# Patient Record
Sex: Female | Born: 1980 | Race: White | Hispanic: No | Marital: Married | Smoking: Former smoker
Health system: Southern US, Community
[De-identification: ages and names within clinical notes are randomized; demographics above are authoritative.]

---

## 2018-06-16 ENCOUNTER — Other Ambulatory Visit: Payer: Self-pay | Admitting: Gastroenterology

## 2018-06-16 DIAGNOSIS — R109 Unspecified abdominal pain: Secondary | ICD-10-CM

## 2018-06-28 ENCOUNTER — Ambulatory Visit
Admission: RE | Admit: 2018-06-28 | Discharge: 2018-06-28 | Disposition: A | Discharge status: Home / Self Care | Payer: BLUE CROSS/BLUE SHIELD | Source: Ambulatory Visit | Attending: Gastroenterology | Admitting: Gastroenterology

## 2018-06-28 ENCOUNTER — Encounter: Payer: Self-pay | Admitting: Radiology

## 2018-06-28 DIAGNOSIS — R109 Unspecified abdominal pain: Secondary | ICD-10-CM

## 2018-06-28 MED ORDER — IOPAMIDOL (ISOVUE-300) INJECTION 61%
100.0000 mL | Freq: Once | INTRAVENOUS | Status: AC | PRN
  Administered 2018-06-28: 100 mL via INTRAVENOUS

## 2020-09-20 ENCOUNTER — Institutional Professional Consult (permissible substitution): Payer: BLUE CROSS/BLUE SHIELD | Admitting: Pulmonary Disease

## 2020-10-04 ENCOUNTER — Ambulatory Visit: Payer: BLUE CROSS/BLUE SHIELD | Admitting: Pulmonary Disease

## 2020-10-04 ENCOUNTER — Encounter: Payer: Self-pay | Admitting: Pulmonary Disease

## 2020-10-04 ENCOUNTER — Other Ambulatory Visit: Payer: Self-pay

## 2020-10-04 VITALS — BP 110/70 | HR 68 | Temp 98.0°F | Ht 62.0 in | Wt 118.8 lb

## 2020-10-04 DIAGNOSIS — R042 Hemoptysis: Secondary | ICD-10-CM

## 2020-10-04 NOTE — Progress Notes (Signed)
Terri Spencer    [id number]    Aug 18, 1980  Primary Care Physician:[facility]  Referring Physician: [facility] [address] [place],  [place] [address]  Chief complaint: Consult for hemoptysis  HPI: 40 year old with no significant past medical history Complains of intermittent cough with black specks in the sputum.  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 Smoking history: 10-15-pack-year smoker.  Quit in 2005 Travel history: No significant travel history Relevant family history: Grandfather died of lung cancer.  He was a smoker.   Outpatient Encounter Medications as of 10/04/2020  Medication Sig  . Multiple Vitamin (MULTIVITAMIN) capsule Take by mouth.   No facility-administered encounter medications on file as of 10/04/2020.    Allergies as of 10/04/2020 - Review Complete 10/04/2020  Allergen Reaction Noted  . Amoxicillin Rash 10/22/2017    No past medical history on file.  History reviewed. No pertinent surgical history.  No family history on file.  Social History   Socioeconomic History  . Marital status: Married    Spouse name: Not on file  . Number of children: Not on file  . Years of education: Not on file  . Highest education level: Not on file  Occupational History  . Not on file  Tobacco Use  . Smoking status: Former Smoker    Years: 12.00    Types: Cigarettes    Quit date: 07/07/1995    Years since quitting: 25.2  . Smokeless tobacco: Never Used  Substance and Sexual Activity  . Alcohol use: Not on file  . Drug use: Not on file  . Sexual activity: Not on file  Other Topics Concern  . Not on file  Social History Narrative  . Not on file   Social Determinants of Health    Financial Resource Strain: Not on file  Food Insecurity: Not on file  Transportation Needs: Not on file  Physical Activity: Not on file  Stress: Not on file  Social Connections: Not on file  Intimate Partner Violence: Not on file    Review of systems: Review of Systems  Constitutional: Negative for fever and chills.  HENT: Negative.   Eyes: Negative for blurred vision.  Respiratory: as per HPI  Cardiovascular: Negative for chest pain and palpitations.  Gastrointestinal: Negative for vomiting, diarrhea, blood per rectum. Genitourinary: Negative for dysuria, urgency, frequency and hematuria.  Musculoskeletal: Negative for myalgias, back pain and joint pain.  Skin: Negative for itching and rash.  Neurological: Negative for dizziness, tremors, focal weakness, seizures and loss of consciousness.  Endo/Heme/Allergies: Negative for environmental allergies.  Psychiatric/Behavioral: Negative for depression, suicidal ideas and hallucinations.  All other systems reviewed and are negative.  Physical Exam: Blood pressure 110/70, pulse 68, temperature 98 F (36.7 C), temperature source Temporal, height 5\' 2"  (1.575 m), weight 118 lb 12.8 oz (53.9 kg), SpO2 98 %. Gen:      No acute distress HEENT:  EOMI, sclera anicteric Neck:     No masses; no thyromegaly Lungs:    Clear to auscultation bilaterally; normal respiratory effort CV:         Regular rate and rhythm; no murmurs Abd:      + bowel sounds; soft, non-tender; no palpable masses, no distension Ext:  No edema; adequate peripheral perfusion Skin:      Warm and dry; no rash Neuro: alert and oriented x 3 Psych: normal mood and affect  Data Reviewed: Imaging: CT abdomen pelvis 06/28/2018-mild atelectatic changes/groundglass at the base I have reviewed the images personally  PFTs:  Labs:  Assessment:  Evaluation of cough Concern for hemoptysis though she is describing black specks in the mucus and not overt blood Given  history of smoking we will schedule a CT We will also enable [place] to evaluate for hypersensitivity pneumonitis given down exposure. Prior CT abdomen in 2019 noted with mild atelectatic changes/groundglass at the base.  Plan/Recommendations: - High-resolution CT chest   Chilton Greathouse MD [facility] 10/04/2020, 10:56 AM  CC: [facility]

## 2020-10-04 NOTE — Patient Instructions (Signed)
We will schedule you for high-resolution CT for evaluation of hemoptysis and lung inflammation Follow-up in 1 to 2 months.

## 2020-10-18 ENCOUNTER — Other Ambulatory Visit: Payer: BC Managed Care – PPO

## 2020-10-29 ENCOUNTER — Other Ambulatory Visit: Payer: BC Managed Care – PPO

## 2020-11-13 ENCOUNTER — Ambulatory Visit: Payer: BC Managed Care – PPO | Admitting: Pulmonary Disease

## 2020-11-28 ENCOUNTER — Other Ambulatory Visit: Payer: BC Managed Care – PPO

## 2021-01-22 ENCOUNTER — Ambulatory Visit
Admission: RE | Admit: 2021-01-22 | Discharge: 2021-01-22 | Disposition: A | Discharge status: Home / Self Care | Payer: BC Managed Care – PPO | Source: Ambulatory Visit | Attending: Pulmonary Disease | Admitting: Pulmonary Disease

## 2021-01-22 ENCOUNTER — Other Ambulatory Visit: Payer: Self-pay

## 2021-01-22 DIAGNOSIS — R042 Hemoptysis: Secondary | ICD-10-CM

## 2021-01-29 ENCOUNTER — Encounter: Payer: Self-pay | Admitting: *Deleted

## 2021-03-05 ENCOUNTER — Ambulatory Visit: Payer: BC Managed Care – PPO | Admitting: Pulmonary Disease

## 2021-04-09 ENCOUNTER — Ambulatory Visit: Payer: BC Managed Care – PPO | Admitting: Pulmonary Disease

## 2021-04-11 ENCOUNTER — Ambulatory Visit: Payer: BC Managed Care – PPO | Admitting: Pulmonary Disease

## 2021-05-17 ENCOUNTER — Ambulatory Visit: Payer: BC Managed Care – PPO | Admitting: Pulmonary Disease

## 2021-08-02 ENCOUNTER — Other Ambulatory Visit: Payer: Self-pay | Admitting: Gastroenterology

## 2021-08-02 ENCOUNTER — Ambulatory Visit
Admission: RE | Admit: 2021-08-02 | Discharge: 2021-08-02 | Disposition: A | Discharge status: Home / Self Care | Payer: BC Managed Care – PPO | Source: Ambulatory Visit | Attending: Gastroenterology | Admitting: Gastroenterology

## 2021-08-02 DIAGNOSIS — K5900 Constipation, unspecified: Secondary | ICD-10-CM

## 2021-08-02 DIAGNOSIS — R109 Unspecified abdominal pain: Secondary | ICD-10-CM

## 2021-09-30 ENCOUNTER — Ambulatory Visit: Payer: BC Managed Care – PPO | Admitting: Pulmonary Disease

## 2021-10-22 ENCOUNTER — Ambulatory Visit: Payer: BC Managed Care – PPO | Admitting: Pulmonary Disease

## 2021-10-23 ENCOUNTER — Ambulatory Visit: Payer: BC Managed Care – PPO

## 2021-10-23 ENCOUNTER — Encounter: Payer: Self-pay | Admitting: Pulmonary Disease

## 2021-10-23 ENCOUNTER — Ambulatory Visit: Payer: BC Managed Care – PPO | Admitting: Pulmonary Disease

## 2021-10-23 VITALS — BP 112/62 | HR 65 | Temp 97.8°F | Ht 62.0 in | Wt 118.2 lb

## 2021-10-23 DIAGNOSIS — R058 Other specified cough: Secondary | ICD-10-CM | POA: Diagnosis not present

## 2021-10-23 NOTE — Progress Notes (Signed)
? ?      ?  Terri Spencer    [id number]    03-Dec-1980 ? ?Primary Care Physician:[facility] ? ?Referring Physician: [facility] ?[address] ?[place],  [place] [address] ? ?Chief complaint: Follow-up for hemoptysis ? ?HPI: ?41 year old with no significant past medical history ?Complains of intermittent cough with black specks in the sputum.  Not associated with shortness of breath ?She has a smoking history and has been referred for further evaluation ? 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 ?Smoking history: 10-15-pack-year smoker.  Quit in 2005 ?Travel history: No significant travel history ?Relevant family history: Grandfather died of lung cancer.  He was a smoker. ? ?Interim history: ?She had a high-resolution CT last year for evaluation of intermittent cough with black specks with no clear source identified.  Her lungs were.  She brought photos to show me. ? ?Denies any dyspnea, fevers, chills or congestion ? ? ?Outpatient Encounter Medications as of 10/23/2021  ?Medication Sig  ? Multiple Vitamin (MULTIVITAMIN) capsule Take by mouth.  ? ?No facility-administered encounter medications on file as of 10/23/2021.  ? ? ?Physical Exam: ?Blood pressure 112/62, pulse 65, temperature 97.8 ?F (36.6 ?C), temperature source Oral, height 5\' 2"  (1.575 m), weight 118 lb 3.2 oz (53.6 kg), SpO2 98 %. ?Gen:      No acute distress ?HEENT:  EOMI, sclera anicteric ?Neck:     No masses; no thyromegaly ?Lungs:    Clear to auscultation bilaterally; normal respiratory effort ?CV:         Regular rate and rhythm; no murmurs ?Abd:      + bowel sounds; soft, non-tender; no palpable masses, no distension ?Ext:    No edema; adequate peripheral perfusion ?Skin:      Warm and dry; no rash ?Neuro: alert and oriented x 3 ?Psych: normal mood and affect  ? ?Data Reviewed: ?Imaging: ?CT abdomen  pelvis 06/28/2018-mild atelectatic changes/groundglass at the base ? ?High-resolution CT 01/22/2021-mild bibasal atelectasis.  No interstitial lung disease ?I have reviewed the images personally ? ?PFTs: ? ? ?Labs: ? ? ?Assessment:  ?Evaluation of cough ?Concern for hemoptysis though she is describing black specks in the mucus and not overt blood ?Her CT did not show any obvious source.  She does have down exposure but there is no evidence of hypersensitivity pneumonitis. ? ?Since this continues to be a recurrent problem I have suggested repeat CT scan and bronchoscopy for airway evaluation.  But she declined due to concerns about radiation and complications of procedure ?We have agreed to get a chest x-ray today and continue monitoring.  If this recurs then then we will reevaluate. ?Follow-up in 6 months. ? ?Plan/Recommendations: ?- Chest x-ray ?Follow-up in 6 months ? ?01/24/2021 MD ?[facility] ?10/23/2021, 2:16 PM ? ?CC: [facility] ? ?

## 2021-10-23 NOTE — Patient Instructions (Signed)
We will get a chest x-ray today Follow-up in 6 months 

## 2023-03-20 IMAGING — CT CT CHEST HIGH RESOLUTION W/O CM
3 of 6 series · 17 of 32 positions shown, 19 images · non-contrast
Comparison: None.

CLINICAL DATA: Hemoptysis, intermittent

EXAM:
CT CHEST WITHOUT CONTRAST
TECHNIQUE: Multidetector CT imaging of the chest was performed following the
standard protocol without intravenous contrast. High resolution
imaging of the lungs, as well as inspiratory and expiratory imaging,
was performed.

[Series 3: chest · axial · 0.66mm/px · z∈[-241,-53]mm · 6 of 142 slices shown, 8 images]
[im 24/142  mediastinal]
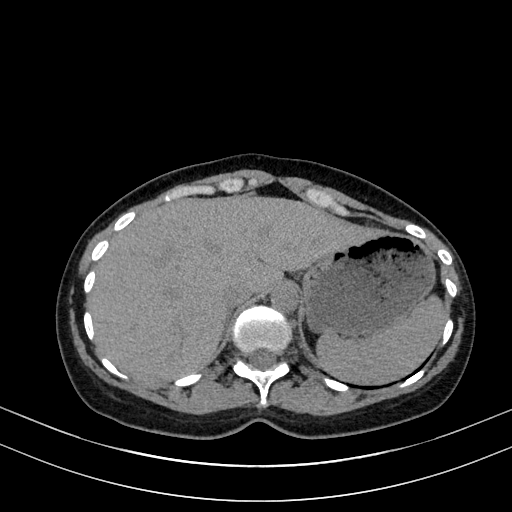
[im 24/142  lung]
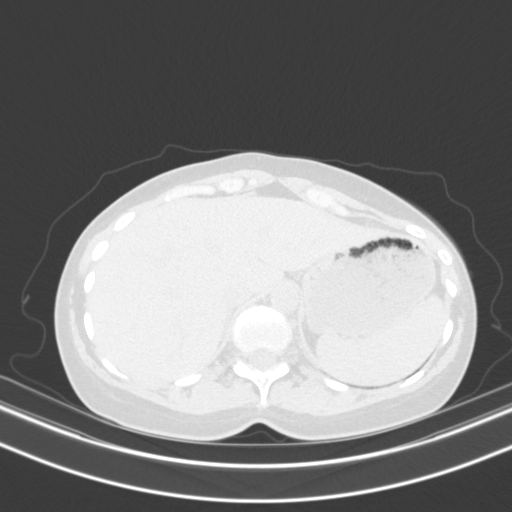
[im 48/142  lung]
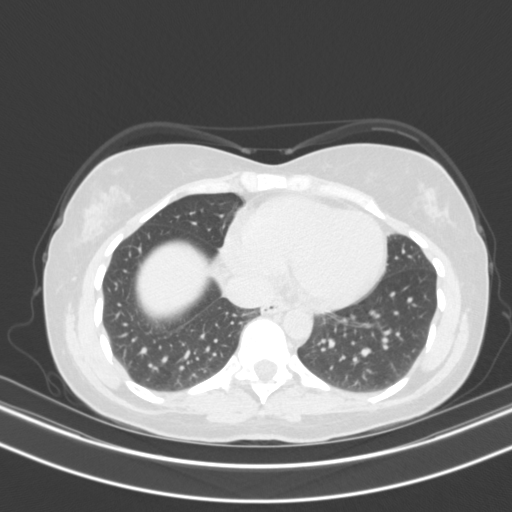
[im 67/142  lung]
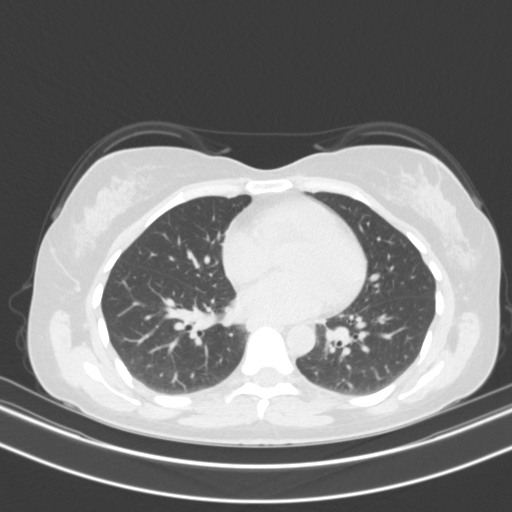
[im 71/142  lung]
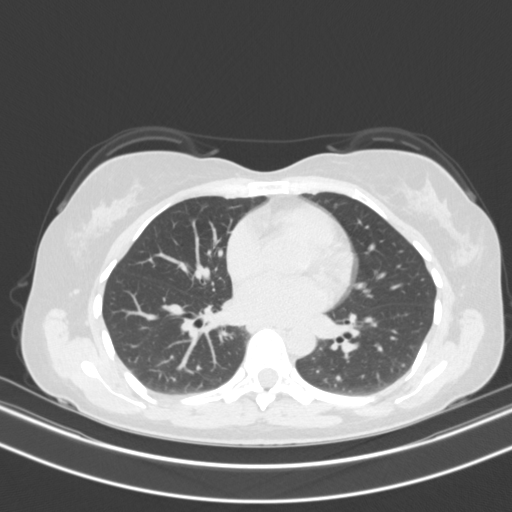
[im 95/142  mediastinal]
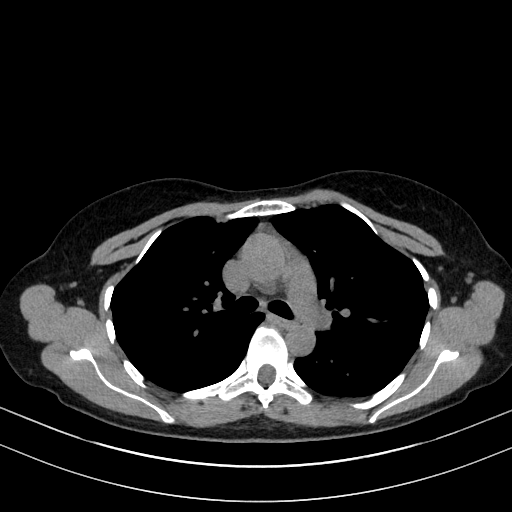
[im 95/142  lung]
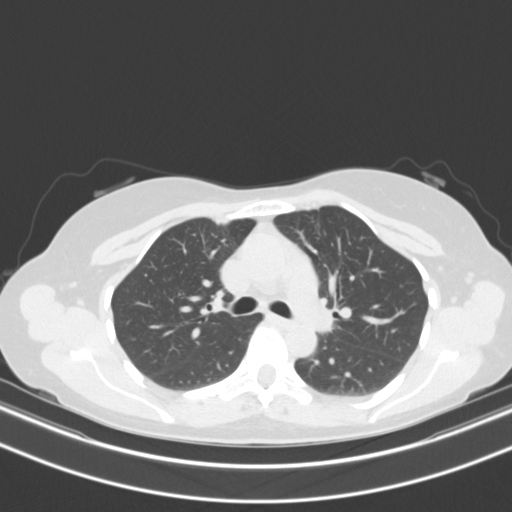
[im 118/142  lung]
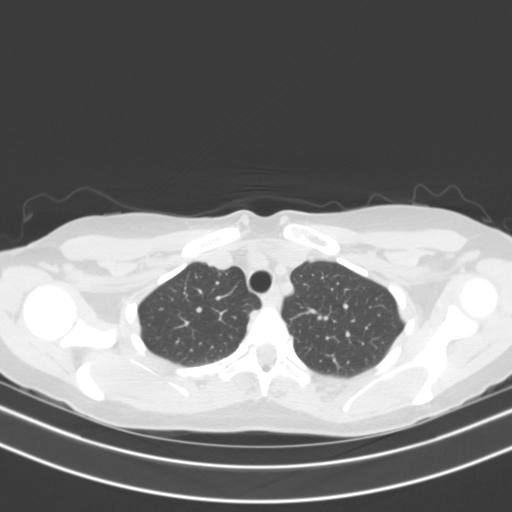

[Series 7: lungs · axial · 0.66mm/px · z∈[-197,-149]mm · 3 of 121 slices shown]
[im 25/121  lung]
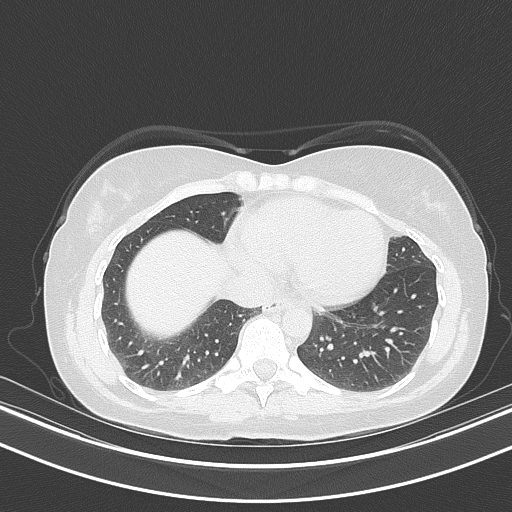
[im 46/121  lung]
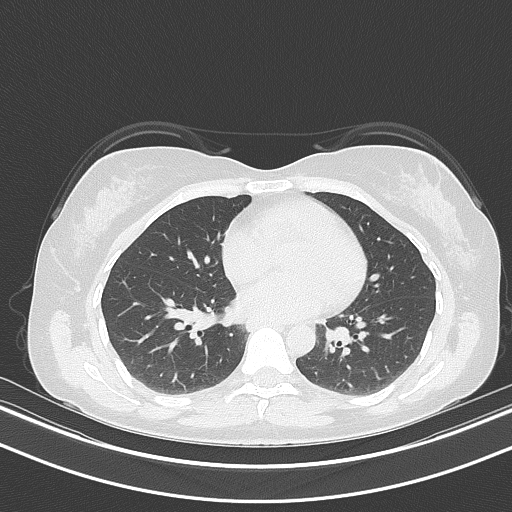
[im 49/121  lung]
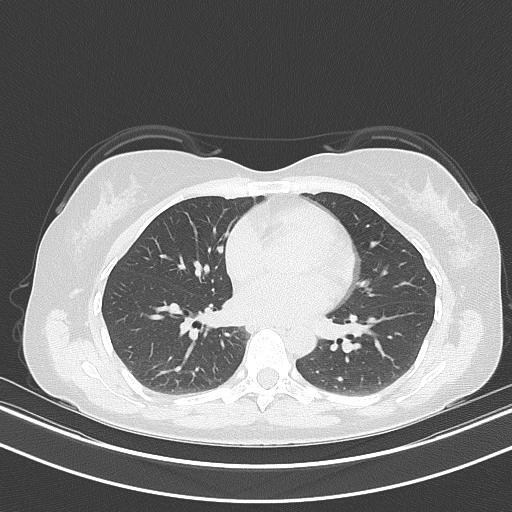

[Series 10: hi res 1mm · axial · 0.66mm/px · z∈[-225,-27]mm · 8 of 242 slices shown]
[im 22/242  lung]
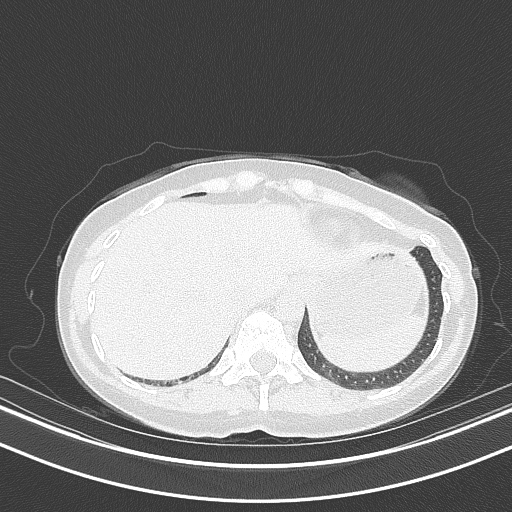
[im 66/242  lung]
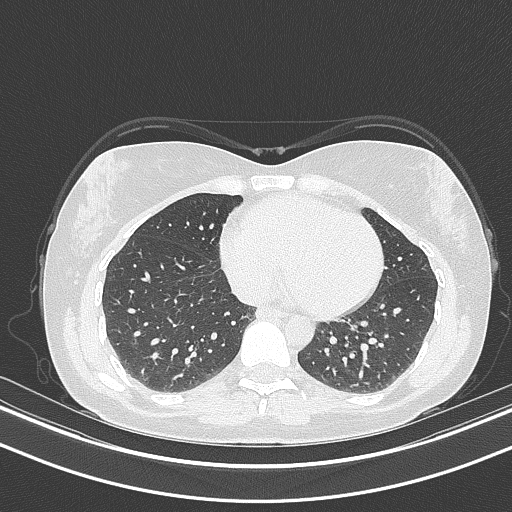
[im 88/242  lung]
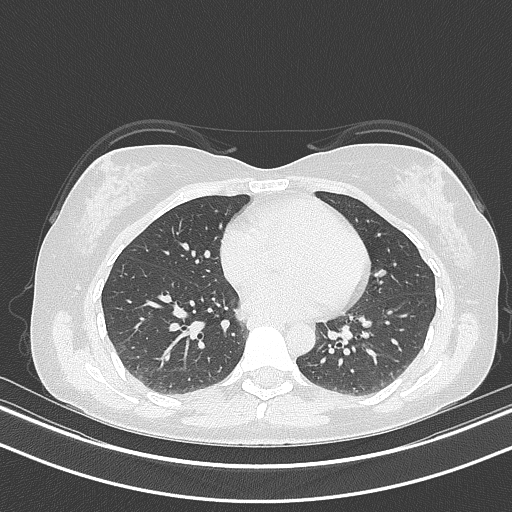
[im 91/242  lung]
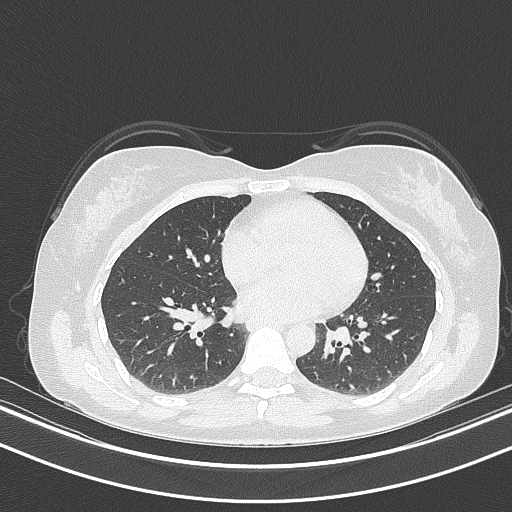
[im 132/242  lung]
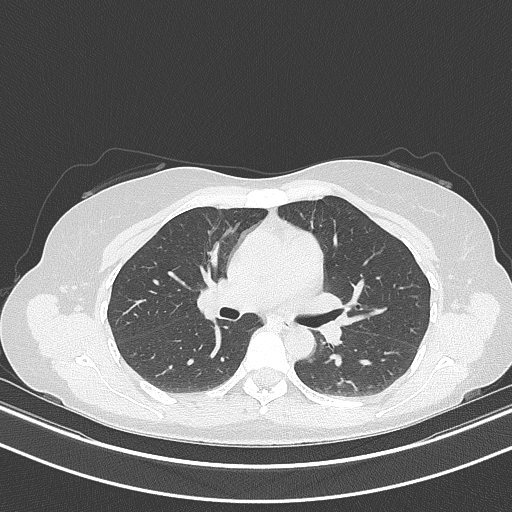
[im 154/242  lung]
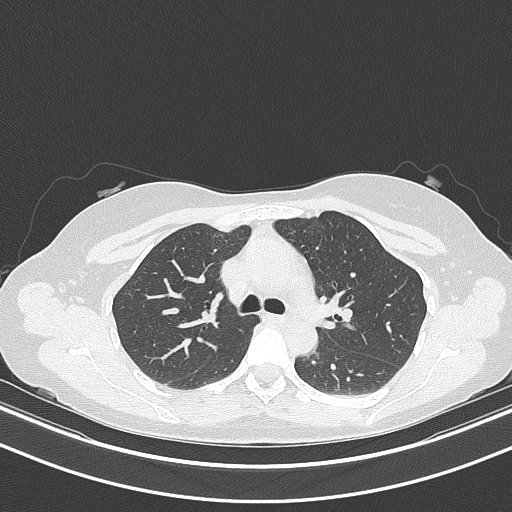
[im 176/242  lung]
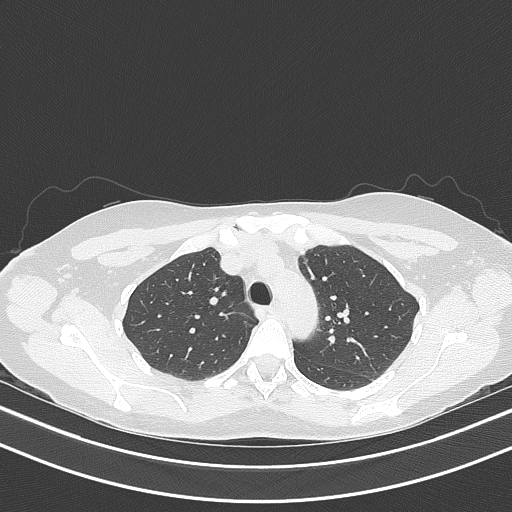
[im 220/242  lung]
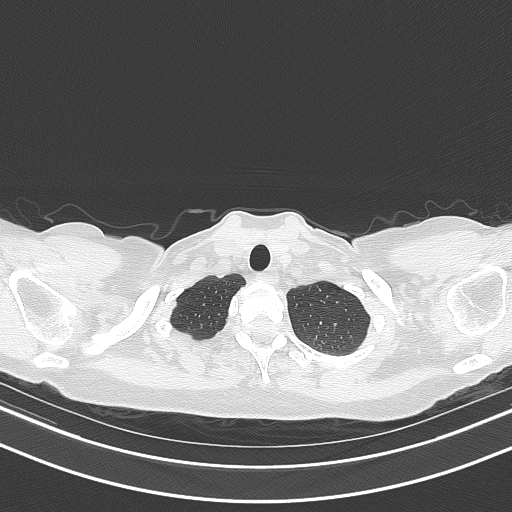

[17 of 32 positions shown; findings below may reference images not displayed]

FINDINGS: Cardiovascular: No significant vascular findings. Normal heart size.
No pericardial effusion.

Mediastinum/Nodes: No enlarged mediastinal, hilar, or axillary lymph
nodes. Thyroid gland, trachea, and esophagus demonstrate no
significant findings.

Lungs/Pleura: Minimal dependent bibasilar partial atelectasis and/or
scarring. No significant air trapping on expiratory phase imaging.
No pleural effusion or pneumothorax.

Upper Abdomen: No acute abnormality.

Musculoskeletal: No chest wall mass or suspicious bone lesions
identified.
IMPRESSION: 1. No CT findings of the chest to explain hemoptysis.
2. No evidence of fibrotic interstitial lung disease.

## 2023-09-28 IMAGING — CR DG ABDOMEN 2V
2 series · 2 of 2 positions shown · non-contrast
Comparison: 06/28/2018

CLINICAL DATA: Left-sided abdominal pain

EXAM:
ABDOMEN - 2 VIEW

[w abdomen upright]
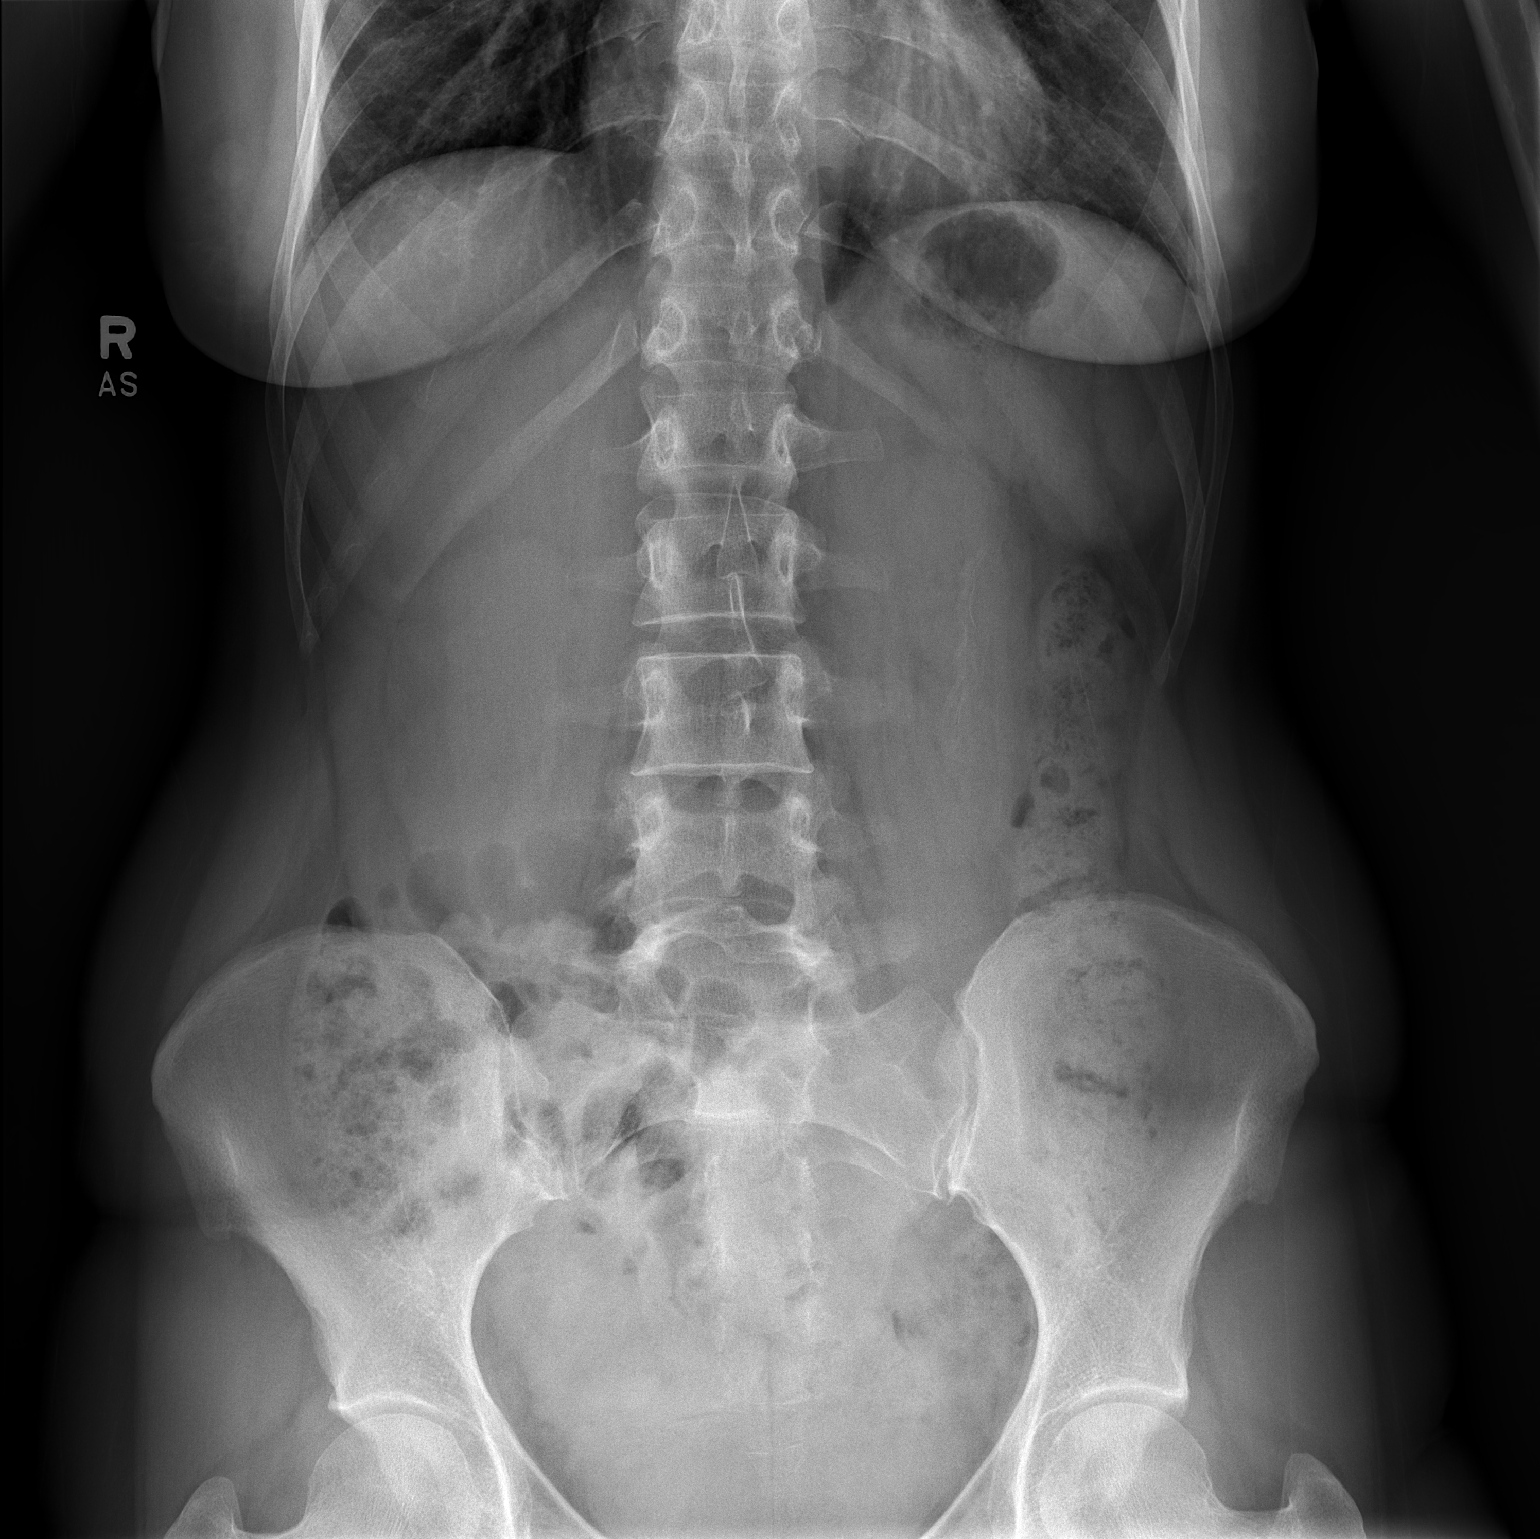

[t abdomen supine]
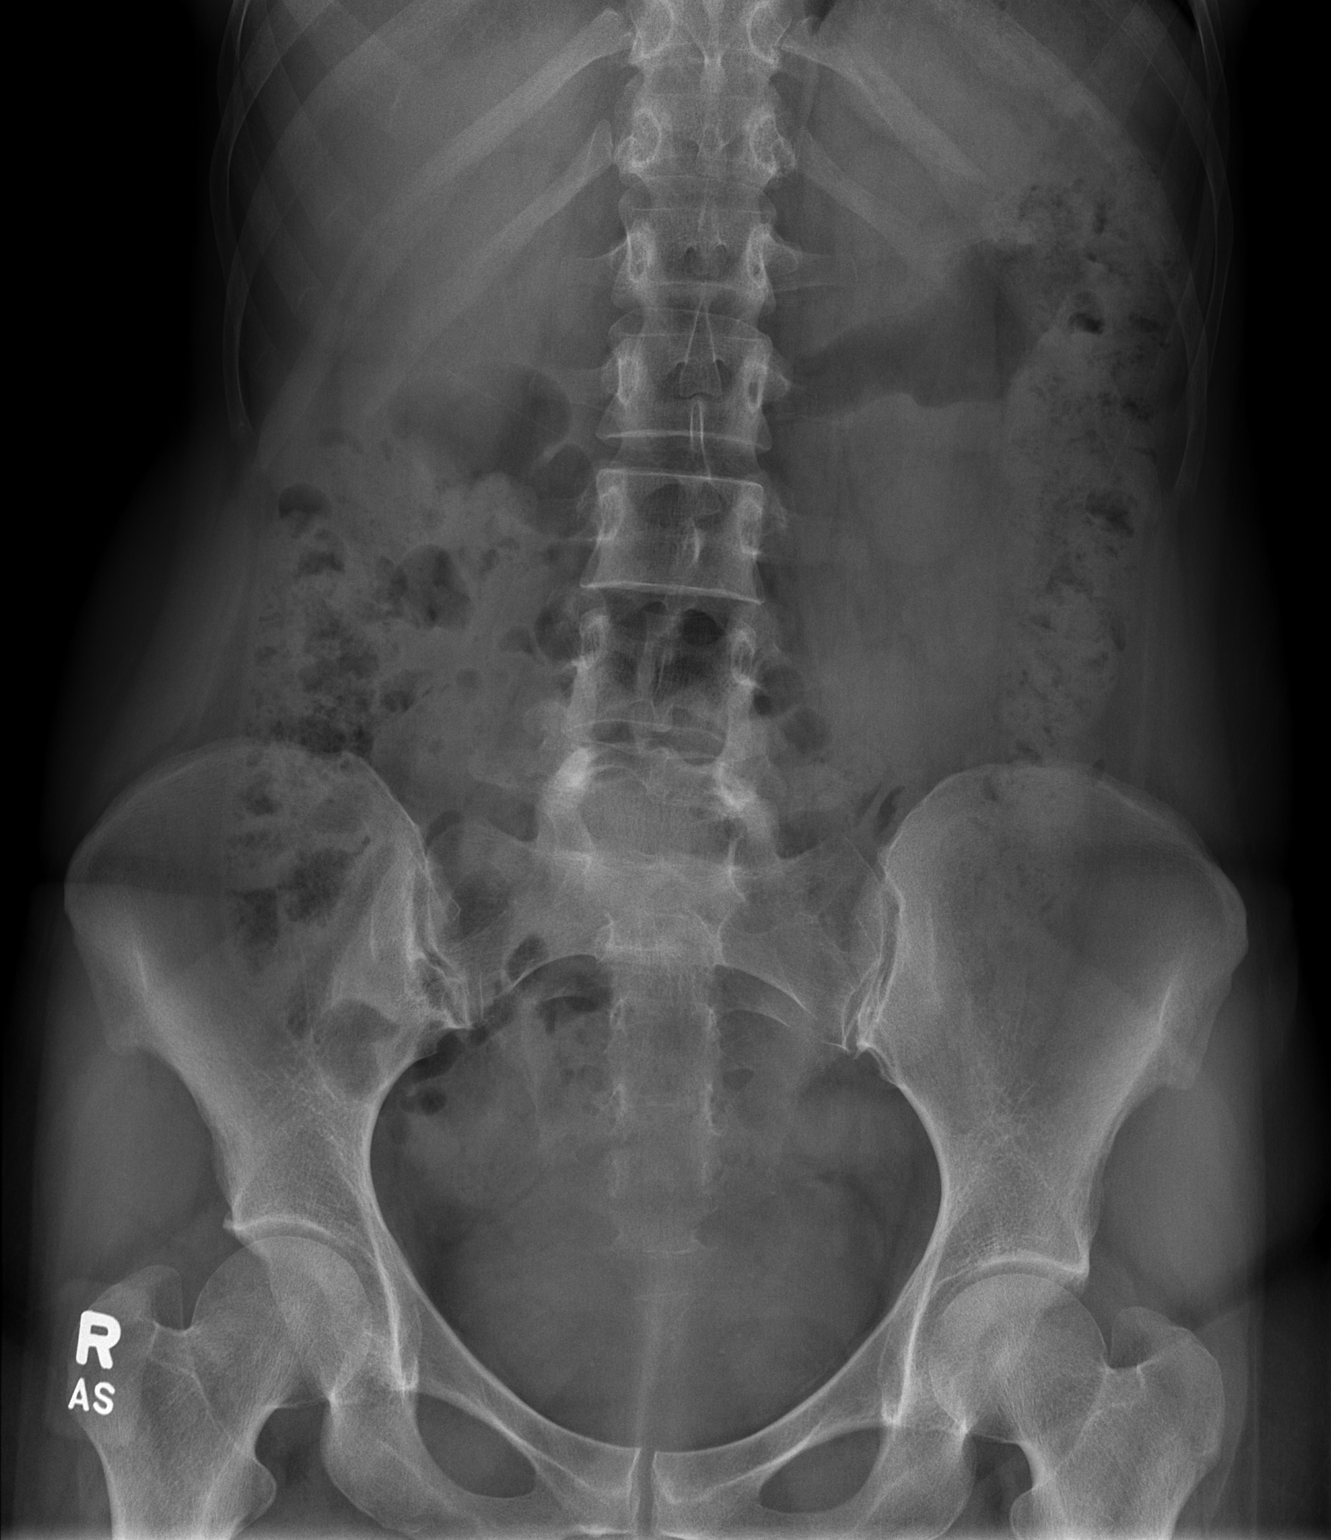

[2 of 2 positions shown; findings below may reference images not displayed]

FINDINGS: Scattered large and small bowel gas is noted. Fecal material is
noted throughout the colon consistent with a degree of constipation.
No obstructive changes are seen. No free air is noted. No abnormal
mass or abnormal calcifications are seen. Bony structures are within
normal limits.
IMPRESSION: Changes consistent with mild constipation.
